# Patient Record
Sex: Male | Born: 1955 | Race: White | Hispanic: No | Marital: Single | Smoking: Never smoker
Health system: Southern US, Community
[De-identification: ages and names within clinical notes are randomized; demographics above are authoritative.]

## PROBLEM LIST (undated history)

## (undated) DIAGNOSIS — I10 Essential (primary) hypertension: Secondary | ICD-10-CM

---

## 2016-01-22 DIAGNOSIS — I10 Essential (primary) hypertension: Secondary | ICD-10-CM | POA: Diagnosis not present

## 2016-01-22 DIAGNOSIS — E782 Mixed hyperlipidemia: Secondary | ICD-10-CM | POA: Diagnosis not present

## 2016-01-22 DIAGNOSIS — H538 Other visual disturbances: Secondary | ICD-10-CM | POA: Diagnosis not present

## 2016-02-09 DIAGNOSIS — H40011 Open angle with borderline findings, low risk, right eye: Secondary | ICD-10-CM | POA: Diagnosis not present

## 2016-02-09 DIAGNOSIS — H524 Presbyopia: Secondary | ICD-10-CM | POA: Diagnosis not present

## 2016-02-09 DIAGNOSIS — H40012 Open angle with borderline findings, low risk, left eye: Secondary | ICD-10-CM | POA: Diagnosis not present

## 2016-02-09 DIAGNOSIS — H2513 Age-related nuclear cataract, bilateral: Secondary | ICD-10-CM | POA: Diagnosis not present

## 2016-07-29 DIAGNOSIS — I10 Essential (primary) hypertension: Secondary | ICD-10-CM | POA: Diagnosis not present

## 2016-07-29 DIAGNOSIS — E782 Mixed hyperlipidemia: Secondary | ICD-10-CM | POA: Diagnosis not present

## 2016-07-29 DIAGNOSIS — Z23 Encounter for immunization: Secondary | ICD-10-CM | POA: Diagnosis not present

## 2016-07-29 DIAGNOSIS — Z1159 Encounter for screening for other viral diseases: Secondary | ICD-10-CM | POA: Diagnosis not present

## 2016-07-29 DIAGNOSIS — Z0000 Encounter for general adult medical examination without abnormal findings: Secondary | ICD-10-CM | POA: Diagnosis not present

## 2016-07-29 DIAGNOSIS — Z125 Encounter for screening for malignant neoplasm of prostate: Secondary | ICD-10-CM | POA: Diagnosis not present

## 2016-08-16 DIAGNOSIS — H40013 Open angle with borderline findings, low risk, bilateral: Secondary | ICD-10-CM | POA: Diagnosis not present

## 2017-01-23 DIAGNOSIS — E782 Mixed hyperlipidemia: Secondary | ICD-10-CM | POA: Diagnosis not present

## 2017-01-23 DIAGNOSIS — I10 Essential (primary) hypertension: Secondary | ICD-10-CM | POA: Diagnosis not present

## 2017-05-11 ENCOUNTER — Emergency Department (HOSPITAL_COMMUNITY): Payer: BLUE CROSS/BLUE SHIELD

## 2017-05-11 ENCOUNTER — Encounter (HOSPITAL_COMMUNITY): Payer: Self-pay | Admitting: Emergency Medicine

## 2017-05-11 ENCOUNTER — Emergency Department (HOSPITAL_COMMUNITY)
Admission: EM | Admit: 2017-05-11 | Discharge: 2017-05-11 | Disposition: A | Discharge status: Home / Self Care | Payer: BLUE CROSS/BLUE SHIELD | Attending: Emergency Medicine | Admitting: Emergency Medicine

## 2017-05-11 DIAGNOSIS — R1013 Epigastric pain: Secondary | ICD-10-CM | POA: Diagnosis not present

## 2017-05-11 DIAGNOSIS — K859 Acute pancreatitis without necrosis or infection, unspecified: Secondary | ICD-10-CM

## 2017-05-11 DIAGNOSIS — R0602 Shortness of breath: Secondary | ICD-10-CM | POA: Diagnosis not present

## 2017-05-11 DIAGNOSIS — K852 Alcohol induced acute pancreatitis without necrosis or infection: Secondary | ICD-10-CM

## 2017-05-11 DIAGNOSIS — R112 Nausea with vomiting, unspecified: Secondary | ICD-10-CM | POA: Diagnosis not present

## 2017-05-11 DIAGNOSIS — R1010 Upper abdominal pain, unspecified: Secondary | ICD-10-CM | POA: Diagnosis not present

## 2017-05-11 DIAGNOSIS — R10817 Generalized abdominal tenderness: Secondary | ICD-10-CM | POA: Insufficient documentation

## 2017-05-11 DIAGNOSIS — R079 Chest pain, unspecified: Secondary | ICD-10-CM | POA: Diagnosis not present

## 2017-05-11 DIAGNOSIS — I10 Essential (primary) hypertension: Secondary | ICD-10-CM | POA: Diagnosis not present

## 2017-05-11 DIAGNOSIS — R109 Unspecified abdominal pain: Secondary | ICD-10-CM | POA: Diagnosis not present

## 2017-05-11 DIAGNOSIS — R1110 Vomiting, unspecified: Secondary | ICD-10-CM | POA: Diagnosis not present

## 2017-05-11 HISTORY — DX: Essential (primary) hypertension: I10

## 2017-05-11 LAB — URINALYSIS, ROUTINE W REFLEX MICROSCOPIC
Bilirubin Urine: NEGATIVE
Glucose, UA: NEGATIVE mg/dL
Hgb urine dipstick: NEGATIVE
Ketones, ur: NEGATIVE mg/dL
Leukocytes, UA: NEGATIVE
Nitrite: NEGATIVE
Protein, ur: NEGATIVE mg/dL
Specific Gravity, Urine: 1.006 (ref 1.005–1.030)
pH: 6 (ref 5.0–8.0)

## 2017-05-11 LAB — CBC
HCT: 53.4 % — ABNORMAL HIGH (ref 39.0–52.0)
Hemoglobin: 18.8 g/dL — ABNORMAL HIGH (ref 13.0–17.0)
MCH: 33.3 pg (ref 26.0–34.0)
MCHC: 35.2 g/dL (ref 30.0–36.0)
MCV: 94.5 fL (ref 78.0–100.0)
PLATELETS: 231 10*3/uL (ref 150–400)
RBC: 5.65 MIL/uL (ref 4.22–5.81)
RDW: 14.8 % (ref 11.5–15.5)
WBC: 13.2 10*3/uL — AB (ref 4.0–10.5)

## 2017-05-11 LAB — BASIC METABOLIC PANEL
Anion gap: 9 (ref 5–15)
BUN: 13 mg/dL (ref 6–20)
CALCIUM: 10 mg/dL (ref 8.9–10.3)
CO2: 27 mmol/L (ref 22–32)
CREATININE: 1.2 mg/dL (ref 0.61–1.24)
Chloride: 100 mmol/L — ABNORMAL LOW (ref 101–111)
GFR calc non Af Amer: >60 mL/min (ref >60)
Glucose, Bld: 111 mg/dL — ABNORMAL HIGH (ref 65–99)
Potassium: 3.8 mmol/L (ref 3.5–5.1)
SODIUM: 136 mmol/L (ref 135–145)

## 2017-05-11 LAB — HEPATIC FUNCTION PANEL
ALT: 482 U/L — ABNORMAL HIGH (ref 17–63)
AST: 324 U/L — ABNORMAL HIGH (ref 15–41)
Albumin: 4.5 g/dL (ref 3.5–5.0)
Alkaline Phosphatase: 216 U/L — ABNORMAL HIGH (ref 38–126)
Bilirubin, Direct: 1.7 mg/dL — ABNORMAL HIGH (ref 0.1–0.5)
Indirect Bilirubin: 1.3 mg/dL — ABNORMAL HIGH (ref 0.3–0.9)
Total Bilirubin: 3 mg/dL — ABNORMAL HIGH (ref 0.3–1.2)
Total Protein: 8.4 g/dL — ABNORMAL HIGH (ref 6.5–8.1)

## 2017-05-11 LAB — LIPASE, BLOOD: Lipase: >400 U/L — ABNORMAL HIGH (ref 11–51)

## 2017-05-11 LAB — I-STAT TROPONIN, ED: Troponin i, poc: 0 ng/mL (ref 0.00–0.08)

## 2017-05-11 MED ORDER — SODIUM CHLORIDE 0.9 % IV BOLUS (SEPSIS)
1000.0000 mL | Freq: Once | INTRAVENOUS | Status: AC
  Administered 2017-05-11: 1000 mL via INTRAVENOUS

## 2017-05-11 MED ORDER — ONDANSETRON 4 MG PO TBDP: 4.0000 mg | ORAL_TABLET | Freq: Three times a day (TID) | ORAL | 0 refills | Status: AC | PRN

## 2017-05-11 MED ORDER — HYDROCODONE-ACETAMINOPHEN 5-325 MG PO TABS: 2.0000 | ORAL_TABLET | ORAL | 0 refills | Status: AC | PRN

## 2017-05-11 MED ORDER — MORPHINE SULFATE (PF) 4 MG/ML IV SOLN
4.0000 mg | Freq: Once | INTRAVENOUS | Status: AC
Start: 2017-05-11 — End: 2017-05-11
  Administered 2017-05-11: 4 mg via INTRAVENOUS
  Filled 2017-05-11: qty 1

## 2017-05-11 MED ORDER — PANTOPRAZOLE SODIUM 40 MG IV SOLR
40.0000 mg | Freq: Once | INTRAVENOUS | Status: AC
  Administered 2017-05-11: 40 mg via INTRAVENOUS
  Filled 2017-05-11: qty 40

## 2017-05-11 MED ORDER — ONDANSETRON HCL 4 MG/2ML IJ SOLN
4.0000 mg | Freq: Once | INTRAMUSCULAR | Status: AC
  Administered 2017-05-11: 4 mg via INTRAVENOUS
  Filled 2017-05-11: qty 2

## 2017-05-11 MED ORDER — IOPAMIDOL (ISOVUE-300) INJECTION 61%
INTRAVENOUS | Status: AC
  Administered 2017-05-11: 100 mL
  Filled 2017-05-11: qty 100

## 2017-05-11 MED ORDER — MORPHINE SULFATE (PF) 4 MG/ML IV SOLN: 4.0000 mg | Freq: Once | INTRAVENOUS | Status: DC

## 2017-05-11 NOTE — Discharge Instructions (Signed)
Return if any problems.  See your Physician for recheck.  Clear liquids for 24 hours.  Avoid alcohol.

## 2017-05-11 NOTE — ED Triage Notes (Signed)
Cp started on Tuesday vomited x 1 wed,  Had some sob hurts to take a deep breath

## 2017-05-11 NOTE — ED Provider Notes (Signed)
Pt reports he feels better and wants to go home. Ct scan no acute, mild enteritis.  Pt has elevated lft's and lipase over 400. Pt has had gallbladder removed.  Pt admits to to much etoh.  Pt reports he has had pancreatitis in the past.  Pt agrees to avoid alcohol.  Pt will return if symptoms worsen or change.    Meds ordered this encounter  Medications  . pantoprazole (PROTONIX) injection 40 mg  . morphine 4 MG/ML injection 4 mg  . ondansetron (ZOFRAN) injection 4 mg  . sodium chloride 0.9 % bolus 1,000 mL  . iopamidol (ISOVUE-300) 61 % injection    Daphine DeutscherMartin, [place]   : cabinet override  . DISCONTD: morphine 4 MG/ML injection 4 mg  . simvastatin (ZOCOR) 40 MG tablet    Sig: Take 40 mg by mouth every evening.    Refill:  1  . lisinopril-hydrochlorothiazide (PRINZIDE,ZESTORETIC) 20-25 MG tablet    Sig: Take 1 tablet by mouth daily.    Refill:  0  . naproxen sodium (ANAPROX) 220 MG tablet    Sig: Take 440 mg by mouth every morning.  . multivitamin-lutein (OCUVITE-LUTEIN) CAPS capsule    Sig: Take 1 capsule by mouth daily.  . Misc Natural Products (OSTEO BI-FLEX JOINT SHIELD PO)    Sig: Take 2 tablets by mouth every morning.  . Ascorbic Acid (VITAMIN C) 1000 MG tablet    Sig: Take 1,000 mg by mouth daily.  . ondansetron (ZOFRAN ODT) 4 MG disintegrating tablet    Sig: Take 1 tablet (4 mg total) by mouth every 8 (eight) hours as needed for nausea or vomiting.    Dispense:  20 tablet    Refill:  0    Order Specific Question:   Supervising Provider    Answer:   MILLER, BRIAN [[id number]]  . HYDROcodone-acetaminophen (NORCO/VICODIN) 5-325 MG tablet    Sig: Take 2 tablets by mouth every 4 (four) hours as needed.    Dispense:  16 tablet    Refill:  0    Order Specific Question:   Supervising Provider    Answer:   Eber HongMILLER, BRIAN [[id number]]   An After Visit Summary was printed and given to the patient.    Elson AreasSofia, Leocadio Heal K, [place]PA-C 05/11/17 1917    Tegeler, Canary Brimhristopher J, MD 05/12/17 [phone]

## 2017-05-11 NOTE — ED Provider Notes (Signed)
[facility] Provider [facility]   CSN: [id number] Arrival date & time: 05/11/17  1319     History   Chief Complaint Chief Complaint  Patient presents with  . Chest Pain    HPI Sean Hawkins is a 61 y.o. male with history of hypertension who presents with a 6-day history of intermittent upper abdominal chest pain.  Patient has had pain with deep inspiration in his epigastric, left upper quadrant area.  He has had 2 episodes of vomiting with associated nausea and belching.  He vomited once 5 days ago and once this morning.  He presents from his primary doctor who sent him here for further evaluation and treatment.  He denies any radiation of pain that is constant, but it may have occasional shooting to the back.  Patient is status post cholecystectomy.  He also has history of pancreatitis.  He equates the pain to prior to having his gallbladder removed and having pancreatitis.  What he describes it like a band across his upper abdomen and lower chest.  He denies any recent long trips, surgeries, cancer, new leg pain or swelling, history of blood clots.  He denies any diarrhea or blood in his stools.  He does note that his urine has been darker lately and thinks he may be dehydrated.  HPI  Past Medical History:  Diagnosis Date  . Hypertension     There are no active problems to display for this patient.   No past surgical history on file.     Home Medications    Prior to Admission medications   Not on File    Family History No family history on file.  Social History Social History  Substance Use Topics  . Smoking status: Never Smoker  . Smokeless tobacco: Never Used  . Alcohol use Yes     Allergies   Penicillins   Review of Systems Review of Systems  Constitutional: Negative for chills and fever.  HENT: Negative for facial swelling and sore throat.   Respiratory: Negative for shortness of breath.   Cardiovascular: Negative  for chest pain.  Gastrointestinal: Positive for abdominal pain, nausea and vomiting. Negative for blood in stool and diarrhea.  Genitourinary: Negative for dysuria.  Musculoskeletal: Negative for back pain.  Skin: Negative for rash and wound.  Neurological: Negative for headaches.  Psychiatric/Behavioral: The patient is nervous/anxious.      Physical Exam Updated Vital Signs BP (!) 140/101   Pulse (!) 113   Temp 98.3 F (36.8 C) (Oral)   Resp (!) 22   Ht 6\' 1"  (1.854 m)   Wt 117 kg (258 lb)   SpO2 96%   BMI 34.04 kg/m   Physical Exam  Constitutional: He appears well-developed and well-nourished. No distress.  HENT:  Head: Normocephalic and atraumatic.  Mouth/Throat: Oropharynx is clear and moist. No oropharyngeal exudate.  Eyes: Pupils are equal, round, and reactive to light. Conjunctivae are normal. Right eye exhibits no discharge. Left eye exhibits no discharge. No scleral icterus.  Neck: Normal range of motion. Neck supple. No thyromegaly present.  Cardiovascular: Normal rate, regular rhythm, normal heart sounds and intact distal pulses.  Exam reveals no gallop and no friction rub.   No murmur heard. Pulmonary/Chest: Effort normal and breath sounds normal. No stridor. No respiratory distress. He has no wheezes. He has no rales. He exhibits no tenderness.  Abdominal: Soft. Bowel sounds are normal. He exhibits no distension. There is tenderness in the right upper quadrant,  epigastric area and left upper quadrant. There is no rebound and no guarding.  Musculoskeletal: He exhibits no edema.  Lymphadenopathy:    He has no cervical adenopathy.  Neurological: He is alert. Coordination normal.  Skin: Skin is warm and dry. No rash noted. He is not diaphoretic. No pallor.  Psychiatric: He has a normal mood and affect.  Nursing note and vitals reviewed.    ED Treatments / Results  Labs (all labs ordered are listed, but only abnormal results are displayed) Labs Reviewed  BASIC  METABOLIC PANEL - Abnormal; Notable for the following:       Result Value   Chloride 100 (*)    Glucose, Bld 111 (*)    All other components within normal limits  CBC - Abnormal; Notable for the following:    WBC 13.2 (*)    Hemoglobin 18.8 (*)    HCT 53.4 (*)    All other components within normal limits  URINALYSIS, ROUTINE W REFLEX MICROSCOPIC  HEPATIC FUNCTION PANEL  LIPASE, BLOOD  I-STAT TROPONIN, ED    EKG  EKG Interpretation  Date/Time:  Sunday May 11 2017 13:29:44 EDT Ventricular Rate:  122 PR Interval:  146 QRS Duration: 132 QT Interval:  334 QTC Calculation: 475 R Axis:   73 Text Interpretation:  Sinus tachycardia Right bundle branch block T wave abnormality, consider inferior ischemia Abnormal ECG No prior  Depressions and abnormal EKG; No STEMI Confirmed by Ross Marcus ([id number]) on 05/11/2017 1:35:39 PM Also confirmed by Ross Marcus ([id number]), editor Misty Stanley [phone])  on 05/11/2017 1:37:36 PM       Radiology Dg Chest 2 View  Result Date: 05/11/2017 CLINICAL DATA:  Chest pain, shortness breath, and vomiting for 5 days. EXAM: CHEST  2 VIEW COMPARISON:  None. FINDINGS: The heart size and mediastinal contours are within normal limits. Low lung volumes are seen with bibasilar atelectasis. No evidence of pulmonary consolidation or pleural effusion. IMPRESSION: Low lung volumes with bibasilar atelectasis. Electronically Signed   By: Myles Rosenthal M.D.   On: 05/11/2017 14:35    Procedures Procedures (including critical care time)  Medications Ordered in ED Medications  iopamidol (ISOVUE-300) 61 % injection (not administered)  pantoprazole (PROTONIX) injection 40 mg (40 mg Intravenous Given 05/11/17 1511)  morphine 4 MG/ML injection 4 mg (4 mg Intravenous Given 05/11/17 1509)  ondansetron (ZOFRAN) injection 4 mg (4 mg Intravenous Given 05/11/17 1507)  sodium chloride 0.9 % bolus 1,000 mL (1,000 mLs Intravenous New Bag/Given 05/11/17 1507)      Initial Impression / Assessment and Plan / ED Course  I have reviewed the triage vital signs and the nursing notes.  Pertinent labs & imaging results that were available during my care of the patient were reviewed by me and considered in my medical decision making (see chart for details).     Patient with a 6-day history of epigastric and left upper quadrant pain.  Fluids, morphine, Protonix, Zofran initiated.  CBC shows WBC 13.2, hemoglobin 8.8.  Labs otherwise unremarkable.  Troponin is negative.  Hepatic function panel, lipase, and urine pending.  Chest x-ray is negative.  EKG shows sinus tachycardia. CTAP pending. At shift change, patient care transferred to Sean Masker, PA-C for continued evaluation, follow up of CTAP and pending labs and determination of disposition.    Final Clinical Impressions(s) / ED Diagnoses   Final diagnoses:  Pain of upper abdomen  Non-intractable vomiting with nausea, unspecified vomiting type    New Prescriptions New Prescriptions  No medications on file     Verdis Prime 05/11/17 1549    Linwood Dibbles, MD 05/12/17 1122

## 2017-05-11 NOTE — ED Notes (Signed)
Patient transported to X-ray 

## 2017-05-16 DIAGNOSIS — R82998 Other abnormal findings in urine: Secondary | ICD-10-CM | POA: Diagnosis not present

## 2017-05-16 DIAGNOSIS — D72828 Other elevated white blood cell count: Secondary | ICD-10-CM | POA: Diagnosis not present

## 2017-05-16 DIAGNOSIS — K859 Acute pancreatitis without necrosis or infection, unspecified: Secondary | ICD-10-CM | POA: Diagnosis not present

## 2017-05-16 DIAGNOSIS — R110 Nausea: Secondary | ICD-10-CM | POA: Diagnosis not present

## 2017-05-17 ENCOUNTER — Emergency Department (HOSPITAL_COMMUNITY)
Admission: EM | Admit: 2017-05-17 | Discharge: 2017-05-17 | Disposition: A | Discharge status: Home / Self Care | Payer: BLUE CROSS/BLUE SHIELD | Attending: Emergency Medicine | Admitting: Emergency Medicine

## 2017-05-17 ENCOUNTER — Encounter (HOSPITAL_COMMUNITY): Payer: Self-pay | Admitting: *Deleted

## 2017-05-17 ENCOUNTER — Emergency Department (HOSPITAL_COMMUNITY): Payer: BLUE CROSS/BLUE SHIELD

## 2017-05-17 DIAGNOSIS — R55 Syncope and collapse: Secondary | ICD-10-CM | POA: Diagnosis not present

## 2017-05-17 DIAGNOSIS — R61 Generalized hyperhidrosis: Secondary | ICD-10-CM | POA: Insufficient documentation

## 2017-05-17 DIAGNOSIS — R740 Nonspecific elevation of levels of transaminase and lactic acid dehydrogenase [LDH]: Secondary | ICD-10-CM | POA: Diagnosis not present

## 2017-05-17 DIAGNOSIS — R42 Dizziness and giddiness: Secondary | ICD-10-CM | POA: Diagnosis not present

## 2017-05-17 DIAGNOSIS — I10 Essential (primary) hypertension: Secondary | ICD-10-CM | POA: Diagnosis not present

## 2017-05-17 DIAGNOSIS — R945 Abnormal results of liver function studies: Secondary | ICD-10-CM | POA: Diagnosis present

## 2017-05-17 DIAGNOSIS — K859 Acute pancreatitis without necrosis or infection, unspecified: Secondary | ICD-10-CM | POA: Insufficient documentation

## 2017-05-17 DIAGNOSIS — R7401 Elevation of levels of liver transaminase levels: Secondary | ICD-10-CM

## 2017-05-17 LAB — I-STAT TROPONIN, ED: Troponin i, poc: 0 ng/mL (ref 0.00–0.08)

## 2017-05-17 LAB — CBC
HCT: 52.3 % — ABNORMAL HIGH (ref 39.0–52.0)
Hemoglobin: 18.4 g/dL — ABNORMAL HIGH (ref 13.0–17.0)
MCH: 33.3 pg (ref 26.0–34.0)
MCHC: 35.2 g/dL (ref 30.0–36.0)
MCV: 94.6 fL (ref 78.0–100.0)
PLATELETS: 316 10*3/uL (ref 150–400)
RBC: 5.53 MIL/uL (ref 4.22–5.81)
RDW: 14.3 % (ref 11.5–15.5)
WBC: 12.8 10*3/uL — ABNORMAL HIGH (ref 4.0–10.5)

## 2017-05-17 LAB — COMPREHENSIVE METABOLIC PANEL
ALBUMIN: 4 g/dL (ref 3.5–5.0)
ALK PHOS: 519 U/L — AB (ref 38–126)
ALT: 413 U/L — AB (ref 17–63)
AST: 178 U/L — ABNORMAL HIGH (ref 15–41)
Anion gap: 15 (ref 5–15)
BILIRUBIN TOTAL: 2.6 mg/dL — AB (ref 0.3–1.2)
BUN: 26 mg/dL — ABNORMAL HIGH (ref 6–20)
CHLORIDE: 93 mmol/L — AB (ref 101–111)
CO2: 22 mmol/L (ref 22–32)
CREATININE: 1.59 mg/dL — AB (ref 0.61–1.24)
Calcium: 9.6 mg/dL (ref 8.9–10.3)
GFR calc Af Amer: 54 mL/min — ABNORMAL LOW (ref >60)
GFR calc non Af Amer: 46 mL/min — ABNORMAL LOW (ref >60)
GLUCOSE: 89 mg/dL (ref 65–99)
Potassium: 4.4 mmol/L (ref 3.5–5.1)
SODIUM: 130 mmol/L — AB (ref 135–145)
Total Protein: 8.3 g/dL — ABNORMAL HIGH (ref 6.5–8.1)

## 2017-05-17 LAB — URINALYSIS, ROUTINE W REFLEX MICROSCOPIC
BILIRUBIN URINE: NEGATIVE
GLUCOSE, UA: NEGATIVE mg/dL
HGB URINE DIPSTICK: NEGATIVE
Ketones, ur: 20 mg/dL — AB
Leukocytes, UA: NEGATIVE
Nitrite: NEGATIVE
Protein, ur: NEGATIVE mg/dL
SPECIFIC GRAVITY, URINE: 1.009 (ref 1.005–1.030)
pH: 6 (ref 5.0–8.0)

## 2017-05-17 LAB — I-STAT CG4 LACTIC ACID, ED
LACTIC ACID, VENOUS: 1.8 mmol/L (ref 0.5–1.9)
Lactic Acid, Venous: 2.06 mmol/L (ref 0.5–1.9)

## 2017-05-17 LAB — LIPASE, BLOOD: Lipase: 57 U/L — ABNORMAL HIGH (ref 11–51)

## 2017-05-17 MED ORDER — SODIUM CHLORIDE 0.9 % IV BOLUS (SEPSIS)
1000.0000 mL | Freq: Once | INTRAVENOUS | Status: AC
  Administered 2017-05-17: 1000 mL via INTRAVENOUS

## 2017-05-17 MED ORDER — OXYCODONE HCL 5 MG PO TABS: 5.0000 mg | ORAL_TABLET | ORAL | 0 refills | Status: AC | PRN

## 2017-05-17 NOTE — ED Provider Notes (Signed)
[facility] Provider Note   CSN: [id number] Arrival date & time: 05/17/17  1335     History   Chief Complaint Chief Complaint  Patient presents with  . Abdominal Pain    HPI Nada BoozerRobert W Laster is a 61 y.o. male.  HPI   61 year old male with a history of alcohol abuse and hypertension presents with concern for elevated LFTs found at his primary care physician's office.  Patient was here on October 21 with concern for epigastric and chest pain, was evaluated and diagnosed with pancreatitis with a lipase greater than 400, elevated LFTs.  He had a CT abdomen pelvis which showed some pancreatic calcifications, some hepatic cysts, without other acute abnormalities.  Reports that he was a heavy drinker prior to this episode of pancreatitis, however has not had a drink in over 1 week (last Friday.)  He has a history of cholecystectomy.  Reports he has been on a clear liquid diet at home, with significant improvement of his abdominal pain.  Reports that he no longer has abdominal pain, nausea or vomiting.  Denies chest pain or shortness of breath.  Reports he has been cautious about advancing his diet up to this point.  His primary care physician sent him over with concern for his LFT elevation.  His PCP also placed him on antibiotics given question of some enteritis on his CT.  Patient reports when he goes to use his abdominal muscles to sit up he has spasm in his abdominal muscles.   While in triage, patient began to feel very lightheaded, diaphoretic and had a syncopal episode.  Reports that he felt very uncomfortable while sitting in the chair, felt like the room was too warm, began to feel warm and lightheaded prior to this episode. Had his blood drawn a few minutes before this.  Denies any chest pain, palpitations or shortness of breath preceding this.  Patient had witnessed syncope for approximately 1 minute by staff in the emergency department and was brought  back to a room.  On arrival to the room, he is able to provide history above, reported improvement.     Past Medical History:  Diagnosis Date  . Hypertension     There are no active problems to display for this patient.   History reviewed. No pertinent surgical history.     Home Medications    Prior to Admission medications   Medication Sig Start Date End Date Taking? Authorizing Provider  Ascorbic Acid (VITAMIN C) 1000 MG tablet Take 1,000 mg by mouth daily.    [provider]  HYDROcodone-acetaminophen (NORCO/VICODIN) 5-325 MG tablet Take 2 tablets by mouth every 4 (four) hours as needed. 05/11/17   Elson AreasSofia, Leslie K, PA-C  lisinopril-hydrochlorothiazide (PRINZIDE,ZESTORETIC) 20-25 MG tablet Take 1 tablet by mouth daily. 03/06/17   [provider]  Misc Natural Products (OSTEO BI-FLEX JOINT SHIELD PO) Take 2 tablets by mouth every morning.    [provider]  multivitamin-lutein (OCUVITE-LUTEIN) CAPS capsule Take 1 capsule by mouth daily.    [provider]  naproxen sodium (ANAPROX) 220 MG tablet Take 440 mg by mouth every morning.    [provider]  ondansetron (ZOFRAN ODT) 4 MG disintegrating tablet Take 1 tablet (4 mg total) by mouth every 8 (eight) hours as needed for nausea or vomiting. 05/11/17   Elson AreasSofia, Leslie K, PA-C  simvastatin (ZOCOR) 40 MG tablet Take 40 mg by mouth every evening. 04/28/17   [provider]  Family History No family history on file.  Social History Social History  Substance Use Topics  . Smoking status: Never Smoker  . Smokeless tobacco: Never Used  . Alcohol use 8.4 oz/week    14 Glasses of wine per week     Allergies   Penicillins   Review of Systems Review of Systems  Constitutional: Negative for fever.  HENT: Negative for sore throat.   Eyes: Negative for visual disturbance.  Respiratory: Negative for shortness of breath.   Cardiovascular: Negative for chest pain.    Gastrointestinal: Negative for abdominal pain, nausea and vomiting.  Genitourinary: Negative for difficulty urinating.  Musculoskeletal: Negative for back pain and neck stiffness.  Skin: Negative for rash.  Neurological: Positive for syncope and light-headedness. Negative for headaches.     Physical Exam Updated Vital Signs BP (!) 141/91   Pulse (!) 101   Temp 98.3 F (36.8 C) (Oral)   Resp (!) 23   Ht 6\' 1"  (1.854 m)   Wt 112.9 kg (249 lb)   SpO2 96%   BMI 32.85 kg/m   Physical Exam  Constitutional: He is oriented to person, place, and time. He appears well-developed and well-nourished. No distress.  Diaphoretic after syncopal episode, on reeval no diaphoresis  HENT:  Head: Normocephalic and atraumatic.  Eyes: Conjunctivae and EOM are normal.  Neck: Normal range of motion.  Cardiovascular: Normal rate, regular rhythm, normal heart sounds and intact distal pulses.  Exam reveals no gallop and no friction rub.   No murmur heard. Pulmonary/Chest: Effort normal and breath sounds normal. No respiratory distress. He has no wheezes. He has no rales.  Abdominal: Soft. He exhibits no distension. There is no tenderness. There is no guarding.  Musculoskeletal: He exhibits no edema.  Neurological: He is alert and oriented to person, place, and time.  Skin: Skin is warm and dry. He is not diaphoretic.  Nursing note and vitals reviewed.    ED Treatments / Results  Labs (all labs ordered are listed, but only abnormal results are displayed) Labs Reviewed  LIPASE, BLOOD - Abnormal; Notable for the following:       Result Value   Lipase 57 (*)    All other components within normal limits  COMPREHENSIVE METABOLIC PANEL - Abnormal; Notable for the following:    Sodium 130 (*)    Chloride 93 (*)    BUN 26 (*)    Creatinine, Ser 1.59 (*)    Total Protein 8.3 (*)    AST 178 (*)    ALT 413 (*)    Alkaline Phosphatase 519 (*)    Total Bilirubin 2.6 (*)    GFR calc non Af Amer 46 (*)     GFR calc Af Amer 54 (*)    All other components within normal limits  CBC - Abnormal; Notable for the following:    WBC 12.8 (*)    Hemoglobin 18.4 (*)    HCT 52.3 (*)    All other components within normal limits  URINALYSIS, ROUTINE W REFLEX MICROSCOPIC - Abnormal; Notable for the following:    Ketones, ur 20 (*)    All other components within normal limits  I-STAT CG4 LACTIC ACID, ED - Abnormal; Notable for the following:    Lactic Acid, Venous 2.06 (*)    All other components within normal limits  I-STAT TROPONIN, ED  I-STAT CG4 LACTIC ACID, ED    EKG  EKG Interpretation  Date/Time:  Saturday May 17 2017 14:27:26 EDT Ventricular Rate:  98 PR Interval:    QRS Duration: 142 QT Interval:  378 QTC Calculation: 483 R Axis:   34 Text Interpretation:  Sinus rhythm Right bundle branch block Baseline wander in lead(s) II III aVF No significant change since last tracing Confirmed by Alvira Monday ([id number]) on 05/17/2017 2:37:58 PM       Radiology No results found.  Procedures Procedures (including critical care time)  Medications Ordered in ED Medications  sodium chloride 0.9 % bolus 1,000 mL (1,000 mLs Intravenous New Bag/Given 05/17/17 1447)  sodium chloride 0.9 % bolus 1,000 mL (0 mLs Intravenous Stopped 05/17/17 1752)     Initial Impression / Assessment and Plan / ED Course  I have reviewed the triage vital signs and the nursing notes.  Pertinent labs & imaging results that were available during my care of the patient were reviewed by me and considered in my medical decision making (see chart for details).     61 year old male with a history of alcohol abuse, hypertension, cholecystectomy, recent diagnosis of likely alcoholic pancreatitis, presents with concern for elevated LFTs found at his primary care physician's office.  While in triage patient had a witnessed syncopal episode preceded by feeling uncomfortable in warm room and chair and was brought  immediately to an ED room.  Regarding patient's LFTs, these have improved from prior visit 6 days ago, his lipase has improved from above 400 to 50, and he reports he feels much better, denying chest pain or abdominal pain.  Given patient is not having pain, is having improvement of his labs, doubt common bile duct stone, and do not feel he requires admission at this time for failed outpatient treatment of pancreatitis.  He is appropriate for outpatient follow up regarding his LFT elevation which may be secondary to etoh.   Regarding syncope, suspect likely vasovagal episode.  RBBB noted on EKG unchanged from prior. Given no chest pain or shortness of breath, have a low suspicion for pulmonary embolus, ACS, or dissection. Troponin negative.  Given situation of patient stating he was uncomfortable sitting in the triage room, followed by lightheadedness and episode, feel vasovagal is most likely.    Also suspect some degree of dehydration with patient being unable to eat/drink regular diet over the last 6 days. Cr and BUN with slight elevation, lactic acid with slight elevation.  Plan to hydrate 2L of NS, recheck lactic acid.  If lactic acid improved, patient appropriate for outpatient follow up with PCP and gastroenterology.   Final Clinical Impressions(s) / ED Diagnoses   Final diagnoses:  Acute pancreatitis, unspecified complication status, unspecified pancreatitis type, improved  Transaminitis  Syncope, unspecified syncope type, suspect vasovagal    New Prescriptions New Prescriptions   No medications on file     Alvira Monday, MD 05/17/17 1756

## 2017-05-17 NOTE — Discharge Instructions (Signed)
Your pancreas tests have improved, but your other liver functions are still elevated.  [facility].  Their group will call you Monday for a follow-up appointment.  Avoid alcohol.

## 2017-05-17 NOTE — ED Triage Notes (Signed)
Pt here for elevated liver functions that were drawn at pcp office yesterday, pt denies recent n/v/d, pt seen here Sunday with pancreatitis dx, pt went home with zofran and clear liquid diet, pt tachycardic in triage, denies n/v/d, A&O x4

## 2017-05-17 NOTE — ED Provider Notes (Signed)
Patient reexamined.  He feels much better.  No acute abdomen.  Discussed ultrasound, but patient preferred to wait on this.  I discussed the clinical scenario with Dr. Carman ChingJames Edwards of [facility].  They will follow-up in the office.   Donnetta Hutchingook, Quentez Lober, MD 05/17/17 2040

## 2017-05-17 NOTE — ED Notes (Signed)
Upon returning to room to discuss if the pt had labs available from his PCP, the pt appeared diaphoretic, pt asked he if felt ok, pt unresponsive, pt pale, pt placed in supine position and took to A11, pt remained unresponsive for about 1 minute

## 2017-05-22 ENCOUNTER — Other Ambulatory Visit: Payer: Self-pay | Admitting: Gastroenterology

## 2017-05-22 DIAGNOSIS — Z8719 Personal history of other diseases of the digestive system: Secondary | ICD-10-CM | POA: Diagnosis not present

## 2017-05-22 DIAGNOSIS — R945 Abnormal results of liver function studies: Secondary | ICD-10-CM | POA: Diagnosis not present

## 2017-05-22 DIAGNOSIS — K8689 Other specified diseases of pancreas: Secondary | ICD-10-CM | POA: Diagnosis not present

## 2017-06-05 DIAGNOSIS — Z8719 Personal history of other diseases of the digestive system: Secondary | ICD-10-CM | POA: Diagnosis not present

## 2017-06-11 ENCOUNTER — Inpatient Hospital Stay
Admission: RE | Admit: 2017-06-11 | Discharge: 2017-06-11 | Disposition: A | Discharge status: Home / Self Care | Payer: BLUE CROSS/BLUE SHIELD | Source: Ambulatory Visit | Attending: Gastroenterology | Admitting: Gastroenterology

## 2017-08-04 DIAGNOSIS — Z8719 Personal history of other diseases of the digestive system: Secondary | ICD-10-CM | POA: Diagnosis not present

## 2017-08-04 DIAGNOSIS — I10 Essential (primary) hypertension: Secondary | ICD-10-CM | POA: Diagnosis not present

## 2017-08-04 DIAGNOSIS — Z87898 Personal history of other specified conditions: Secondary | ICD-10-CM | POA: Diagnosis not present

## 2017-08-04 DIAGNOSIS — E782 Mixed hyperlipidemia: Secondary | ICD-10-CM | POA: Diagnosis not present

## 2017-08-04 DIAGNOSIS — Z0000 Encounter for general adult medical examination without abnormal findings: Secondary | ICD-10-CM | POA: Diagnosis not present

## 2017-08-04 DIAGNOSIS — M79641 Pain in right hand: Secondary | ICD-10-CM | POA: Diagnosis not present

## 2017-08-15 DIAGNOSIS — H524 Presbyopia: Secondary | ICD-10-CM | POA: Diagnosis not present

## 2017-08-15 DIAGNOSIS — H25043 Posterior subcapsular polar age-related cataract, bilateral: Secondary | ICD-10-CM | POA: Diagnosis not present

## 2017-08-15 DIAGNOSIS — H40013 Open angle with borderline findings, low risk, bilateral: Secondary | ICD-10-CM | POA: Diagnosis not present

## 2017-08-15 DIAGNOSIS — H43813 Vitreous degeneration, bilateral: Secondary | ICD-10-CM | POA: Diagnosis not present

## 2018-02-17 DIAGNOSIS — R6882 Decreased libido: Secondary | ICD-10-CM | POA: Diagnosis not present

## 2018-02-17 DIAGNOSIS — D751 Secondary polycythemia: Secondary | ICD-10-CM | POA: Diagnosis not present

## 2018-02-17 DIAGNOSIS — I10 Essential (primary) hypertension: Secondary | ICD-10-CM | POA: Diagnosis not present

## 2018-02-17 DIAGNOSIS — Z7251 High risk heterosexual behavior: Secondary | ICD-10-CM | POA: Diagnosis not present

## 2018-02-17 DIAGNOSIS — E782 Mixed hyperlipidemia: Secondary | ICD-10-CM | POA: Diagnosis not present

## 2018-02-17 DIAGNOSIS — Z87898 Personal history of other specified conditions: Secondary | ICD-10-CM | POA: Diagnosis not present

## 2018-02-17 DIAGNOSIS — Z8719 Personal history of other diseases of the digestive system: Secondary | ICD-10-CM | POA: Diagnosis not present

## 2018-02-17 DIAGNOSIS — M109 Gout, unspecified: Secondary | ICD-10-CM | POA: Diagnosis not present

## 2018-03-26 DIAGNOSIS — M109 Gout, unspecified: Secondary | ICD-10-CM | POA: Diagnosis not present

## 2018-08-12 DIAGNOSIS — I10 Essential (primary) hypertension: Secondary | ICD-10-CM | POA: Diagnosis not present

## 2018-08-12 DIAGNOSIS — Z8719 Personal history of other diseases of the digestive system: Secondary | ICD-10-CM | POA: Diagnosis not present

## 2018-08-12 DIAGNOSIS — M109 Gout, unspecified: Secondary | ICD-10-CM | POA: Diagnosis not present

## 2018-08-12 DIAGNOSIS — Z87898 Personal history of other specified conditions: Secondary | ICD-10-CM | POA: Diagnosis not present

## 2018-08-12 DIAGNOSIS — E782 Mixed hyperlipidemia: Secondary | ICD-10-CM | POA: Diagnosis not present

## 2018-08-12 DIAGNOSIS — Z0000 Encounter for general adult medical examination without abnormal findings: Secondary | ICD-10-CM | POA: Diagnosis not present

## 2018-08-12 DIAGNOSIS — D751 Secondary polycythemia: Secondary | ICD-10-CM | POA: Diagnosis not present

## 2018-08-12 DIAGNOSIS — Z125 Encounter for screening for malignant neoplasm of prostate: Secondary | ICD-10-CM | POA: Diagnosis not present

## 2018-09-02 DIAGNOSIS — E860 Dehydration: Secondary | ICD-10-CM | POA: Diagnosis not present

## 2019-10-07 IMAGING — CT CT ABD-PELV W/ CM
2 of 5 series · 15 of 46 positions shown, 17 images · IV contrast (APPLIED)
Comparison: None.

CLINICAL DATA: Upper abdominal pain and vomiting for 1 week. Pain
worsening since last night.

EXAM:
CT ABDOMEN AND PELVIS WITH CONTRAST
TECHNIQUE: Multidetector CT imaging of the abdomen and pelvis was performed
using the standard protocol following bolus administration of
intravenous contrast.
CONTRAST:  100 cc P7GPZ2-UWW IOPAMIDOL (P7GPZ2-UWW) INJECTION 61%

[Series 3: abdomen 5.0 · axial · 0.89mm/px · z∈[+963,+1433]mm · 12 of 112 slices shown, 14 images]
[im 9/112  soft-tissue]
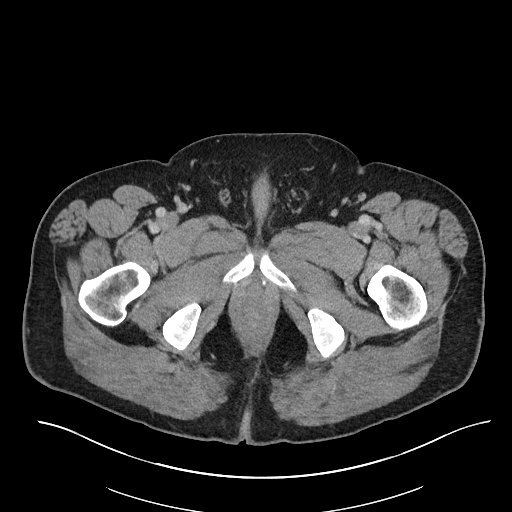
[im 9/112  bone]
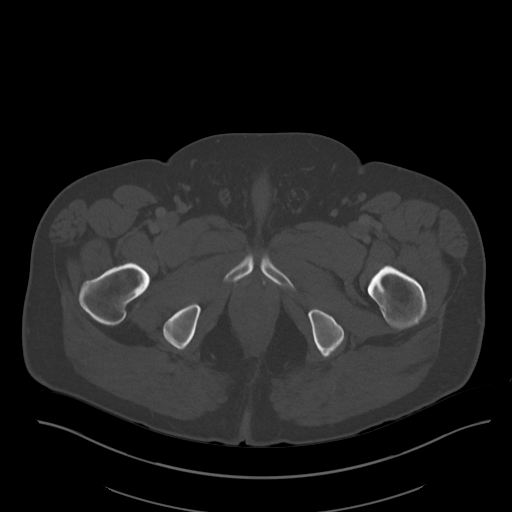
[im 18/112  soft-tissue]
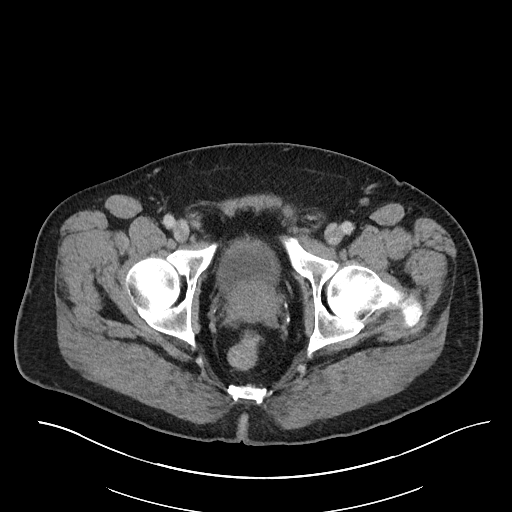
[im 26/112  soft-tissue]
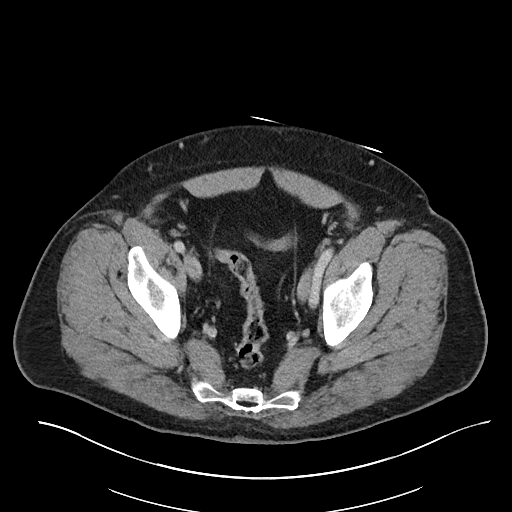
[im 35/112  soft-tissue]
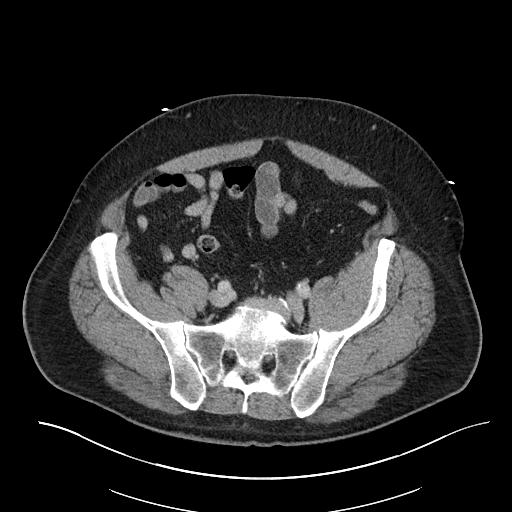
[im 43/112  soft-tissue]
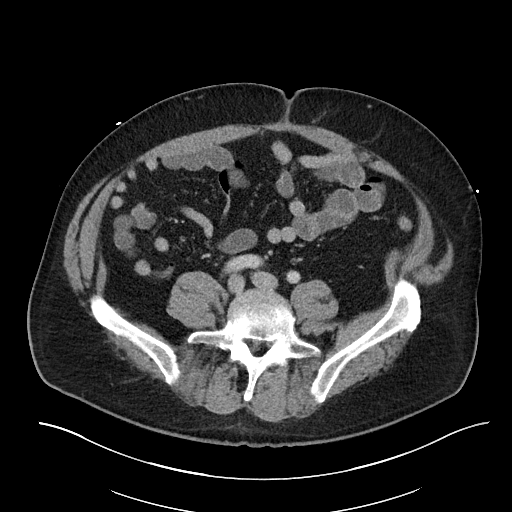
[im 52/112  soft-tissue]
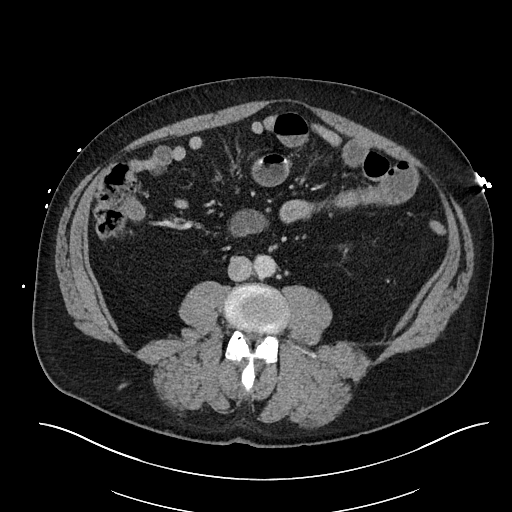
[im 60/112  soft-tissue]
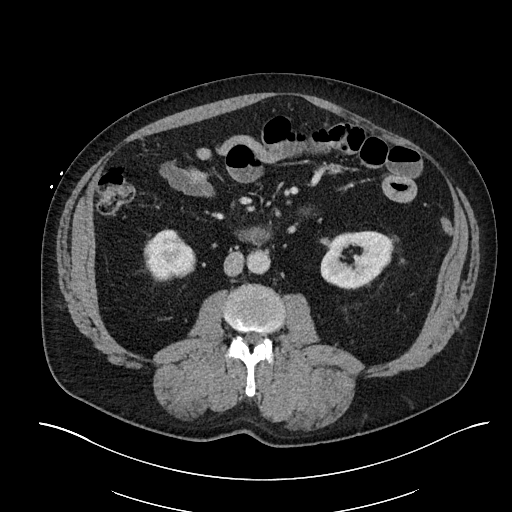
[im 69/112  soft-tissue]
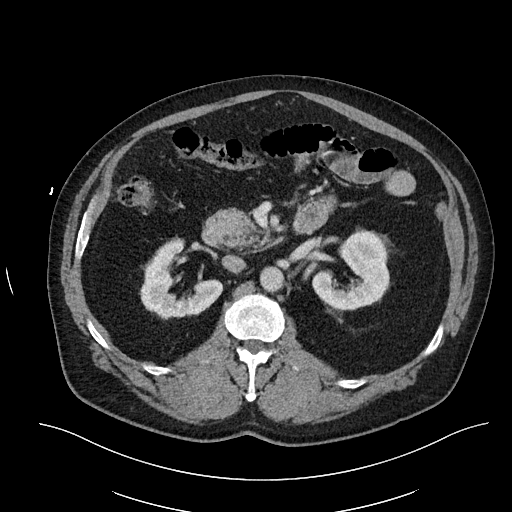
[im 77/112  soft-tissue]
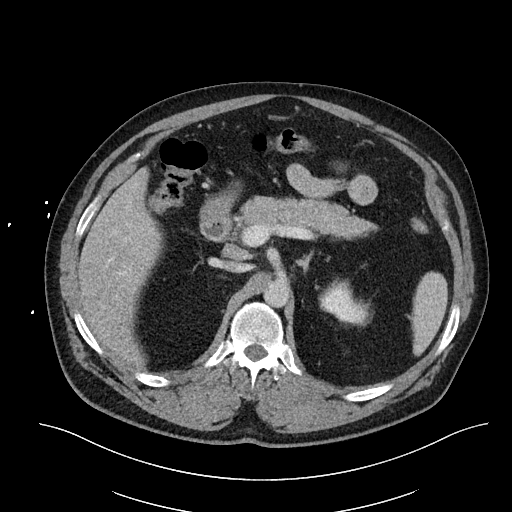
[im 77/112  bone]
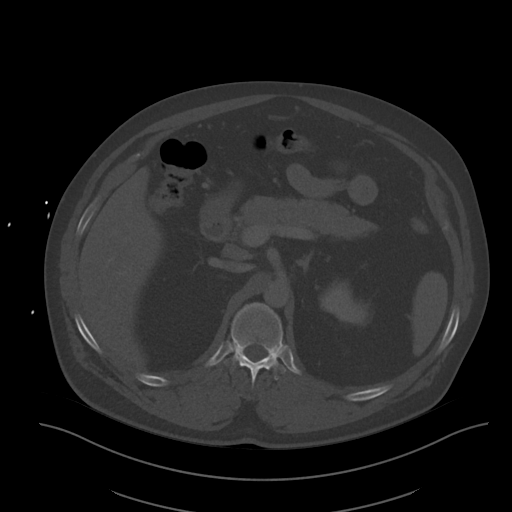
[im 86/112  soft-tissue]
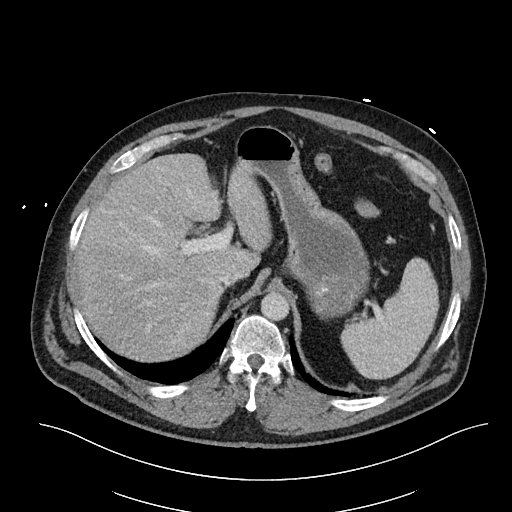
[im 94/112  soft-tissue]
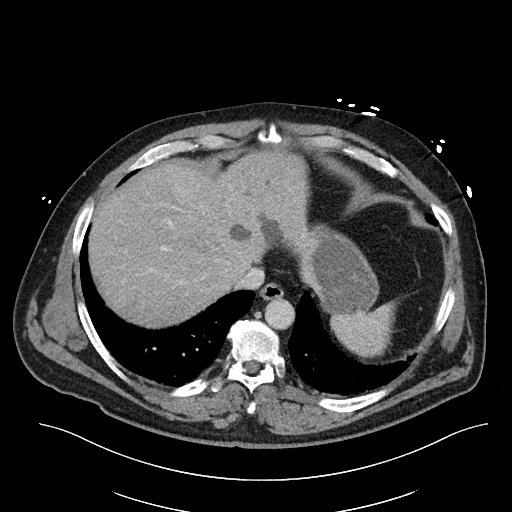
[im 103/112  soft-tissue]
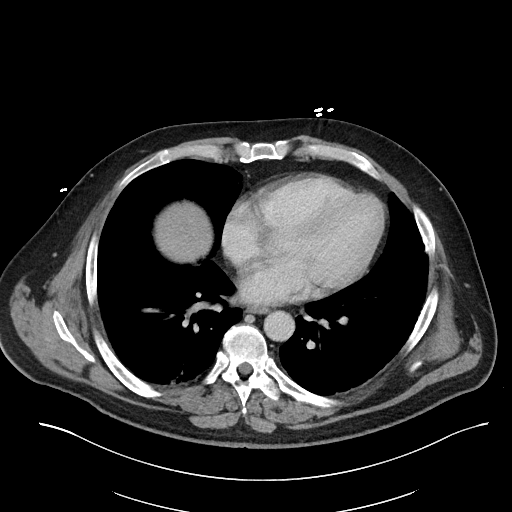

[Series 6: abdomen 3.0 mpr cor · coronal · 0.79mm/px · 3 of 101 slices shown]
[im 34/101  soft-tissue]
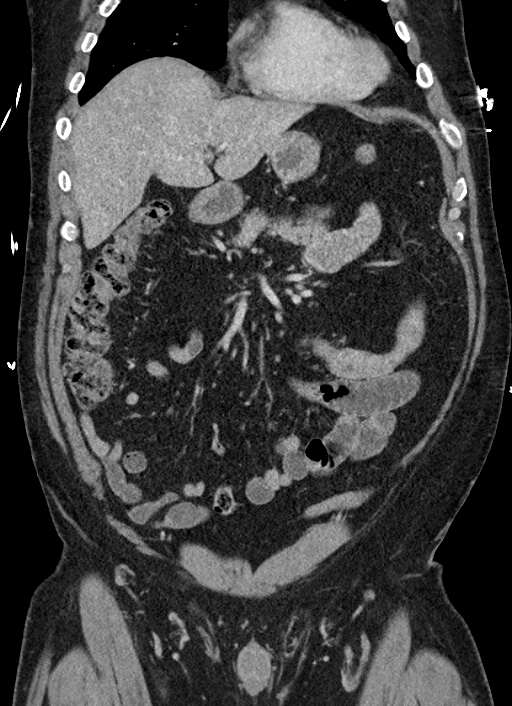
[im 45/101  soft-tissue]
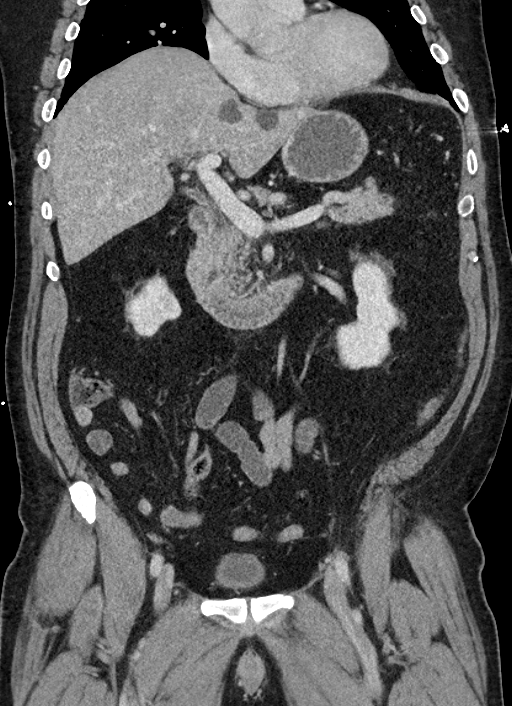
[im 56/101  soft-tissue]
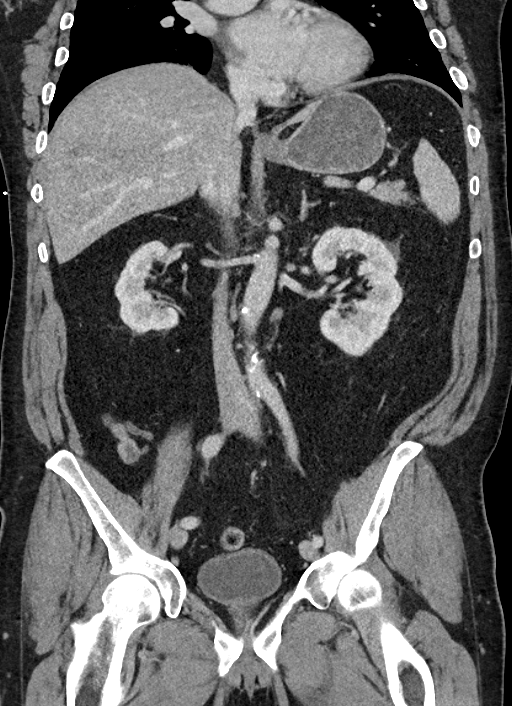

[15 of 46 positions shown; findings below may reference images not displayed]

FINDINGS: LOWER CHEST: Bibasilar atelectasis/scarring. Included heart size is
normal. Mild coronary artery calcifications. No pericardial
effusion.

HEPATOBILIARY: Status post cholecystectomy. At least 3 low-density
cysts in dome of the liver measuring to 2 cm, 2.3 Hounsfield units.

PANCREAS: Coarse calcification pancreatic head, punctate body
pancreatic calcification versus vascular calcification. Increase is
nonacute.

SPLEEN: Normal.

ADRENALS/URINARY TRACT: Kidneys are orthotopic, demonstrating
symmetric enhancement. 3 mm LEFT lower pole nephrolithiasis,
punctate RIGHT interpolar nephrolithiasis. No hydronephrosis or
solid renal masses. The unopacified ureters are normal in course and
caliber. Delayed imaging through the kidneys demonstrates symmetric
prompt contrast excretion within the proximal urinary collecting
system. Urinary bladder is partially distended and unremarkable.
Normal adrenal glands.

STOMACH/BOWEL: Mild fluid distended small bowel LEFT upper quadrant
at 3 cm. The stomach, large bowel are normal in course and caliber
without inflammatory changes. Normal appendix.

VASCULAR/LYMPHATIC: Aortoiliac vessels are normal in course and
caliber. Mild calcific atherosclerosis. 12 mm short access portal
caval lymph node, likely reactive.

REPRODUCTIVE: Mild prostatomegaly.

OTHER: No intraperitoneal free fluid or free air. Small bilateral
fat containing inguinal hernias.

MUSCULOSKELETAL: Nonacute. Severe L5-S1 disc height loss with
endplate spurring compatible with degenerative disc. Severe RIGHT
L5-S1 neural foraminal narrowing.
IMPRESSION: 1. Mild enteritis.
2. Small bilateral nonobstructing nephrolithiasis.

Aortic Atherosclerosis ([id number]).

## 2021-05-31 ENCOUNTER — Telehealth: Payer: Self-pay

## 2021-05-31 NOTE — Telephone Encounter (Signed)
Outbound call to patient offering 2nd Jynneos vaccine. Left voice mail to return call for scheduling as [facility] has limited supply. Valarie Cones

## 2021-06-01 ENCOUNTER — Ambulatory Visit (INDEPENDENT_AMBULATORY_CARE_PROVIDER_SITE_OTHER): Payer: 59

## 2021-06-01 ENCOUNTER — Other Ambulatory Visit: Payer: Self-pay

## 2021-06-01 DIAGNOSIS — Z23 Encounter for immunization: Secondary | ICD-10-CM | POA: Diagnosis not present

## 2022-12-11 DIAGNOSIS — H2512 Age-related nuclear cataract, left eye: Secondary | ICD-10-CM | POA: Diagnosis not present

## 2022-12-11 DIAGNOSIS — H25012 Cortical age-related cataract, left eye: Secondary | ICD-10-CM | POA: Diagnosis not present

## 2022-12-11 DIAGNOSIS — H25042 Posterior subcapsular polar age-related cataract, left eye: Secondary | ICD-10-CM | POA: Diagnosis not present

## 2022-12-11 DIAGNOSIS — H43813 Vitreous degeneration, bilateral: Secondary | ICD-10-CM | POA: Diagnosis not present

## 2023-05-06 DIAGNOSIS — H25012 Cortical age-related cataract, left eye: Secondary | ICD-10-CM | POA: Diagnosis not present

## 2023-05-06 DIAGNOSIS — H40013 Open angle with borderline findings, low risk, bilateral: Secondary | ICD-10-CM | POA: Diagnosis not present

## 2023-05-06 DIAGNOSIS — H2512 Age-related nuclear cataract, left eye: Secondary | ICD-10-CM | POA: Diagnosis not present

## 2023-05-06 DIAGNOSIS — H25042 Posterior subcapsular polar age-related cataract, left eye: Secondary | ICD-10-CM | POA: Diagnosis not present

## 2023-05-29 DIAGNOSIS — H25012 Cortical age-related cataract, left eye: Secondary | ICD-10-CM | POA: Diagnosis not present

## 2023-05-29 DIAGNOSIS — H25812 Combined forms of age-related cataract, left eye: Secondary | ICD-10-CM | POA: Diagnosis not present

## 2023-05-29 DIAGNOSIS — H2512 Age-related nuclear cataract, left eye: Secondary | ICD-10-CM | POA: Diagnosis not present

## 2023-05-29 DIAGNOSIS — H25042 Posterior subcapsular polar age-related cataract, left eye: Secondary | ICD-10-CM | POA: Diagnosis not present

## 2023-10-10 DIAGNOSIS — E782 Mixed hyperlipidemia: Secondary | ICD-10-CM | POA: Diagnosis not present

## 2023-10-10 DIAGNOSIS — Z125 Encounter for screening for malignant neoplasm of prostate: Secondary | ICD-10-CM | POA: Diagnosis not present

## 2023-10-10 DIAGNOSIS — Z0000 Encounter for general adult medical examination without abnormal findings: Secondary | ICD-10-CM | POA: Diagnosis not present

## 2023-10-10 DIAGNOSIS — Z13220 Encounter for screening for lipoid disorders: Secondary | ICD-10-CM | POA: Diagnosis not present

## 2023-10-10 DIAGNOSIS — F324 Major depressive disorder, single episode, in partial remission: Secondary | ICD-10-CM | POA: Diagnosis not present

## 2023-10-10 DIAGNOSIS — M109 Gout, unspecified: Secondary | ICD-10-CM | POA: Diagnosis not present

## 2023-10-10 DIAGNOSIS — I10 Essential (primary) hypertension: Secondary | ICD-10-CM | POA: Diagnosis not present
